# Patient Record
Sex: Male | Born: 1970 | Race: White | Hispanic: No | Marital: Single | Smoking: Former smoker
Health system: Southern US, Community
[De-identification: ages and names within clinical notes are randomized; demographics above are authoritative.]

## PROBLEM LIST (undated history)

## (undated) DIAGNOSIS — B019 Varicella without complication: Secondary | ICD-10-CM

## (undated) HISTORY — DX: Varicella without complication: B01.9

---

## 2006-11-29 ENCOUNTER — Emergency Department: Payer: Self-pay | Admitting: Emergency Medicine

## 2007-01-03 ENCOUNTER — Ambulatory Visit: Payer: Self-pay | Admitting: Urology

## 2008-03-17 IMAGING — US US PELVIS LIMITED
1 series · 17 of 25 positions shown · non-contrast
Comparison: none

REASON FOR EXAM: Previous US with Hypoechoic area
COMMENTS:

[Series 1: us pelvis limited · 17 of 55 slices shown]
[im 1/55]
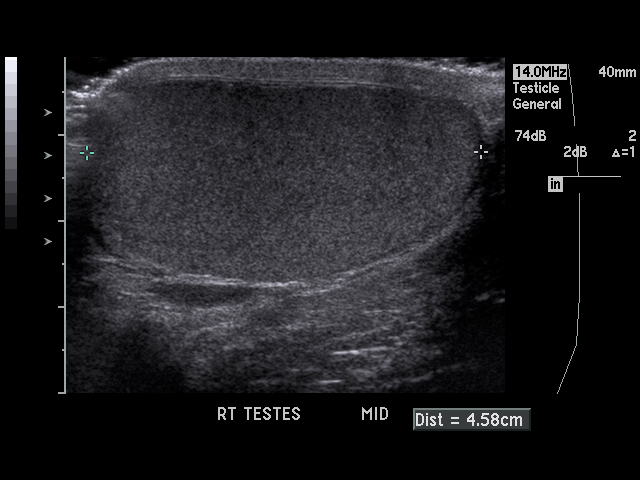
[im 5/55]
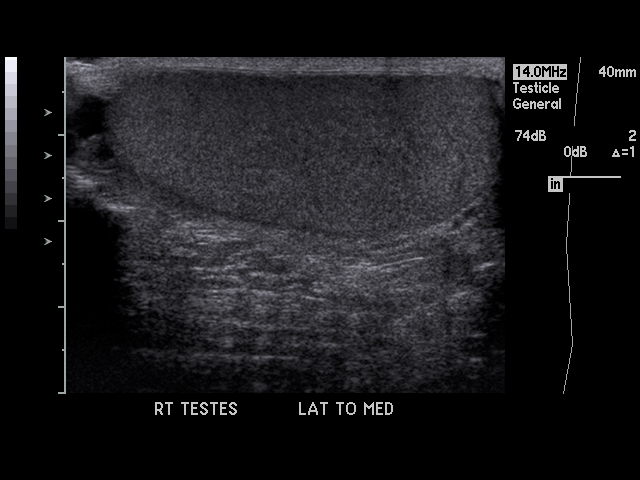
[im 7/55]
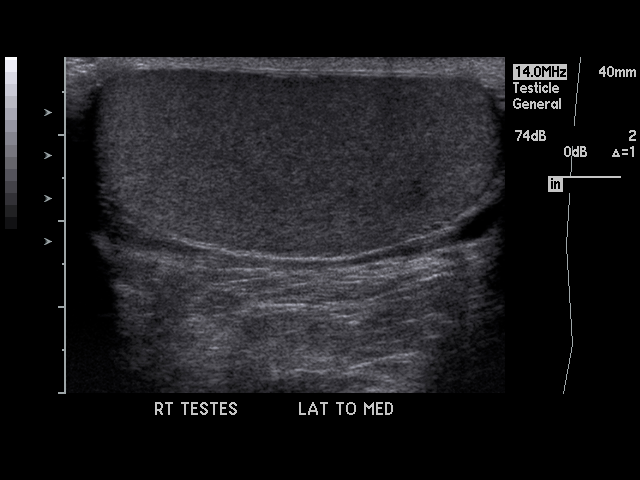
[im 12/55]
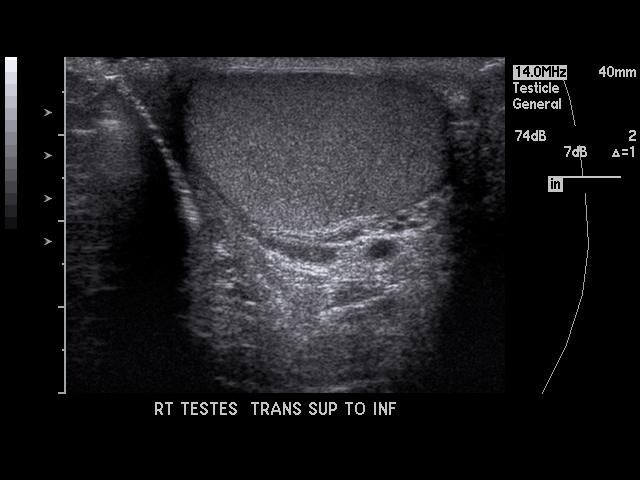
[im 14/55]
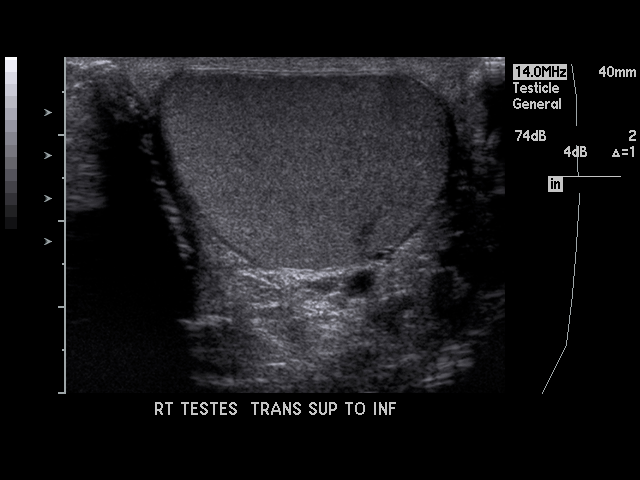
[im 19/55]
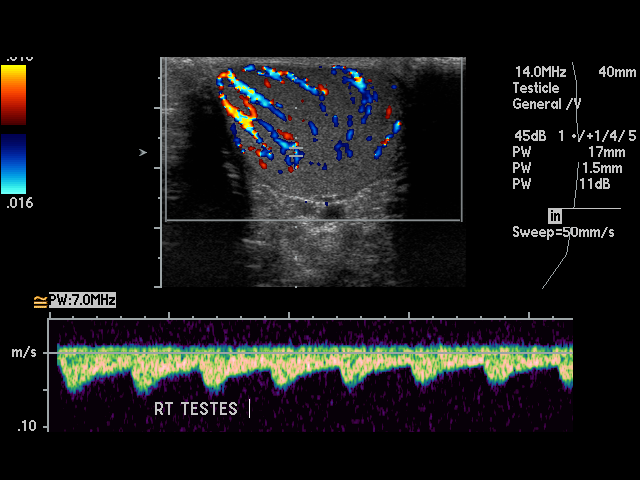
[im 21/55]
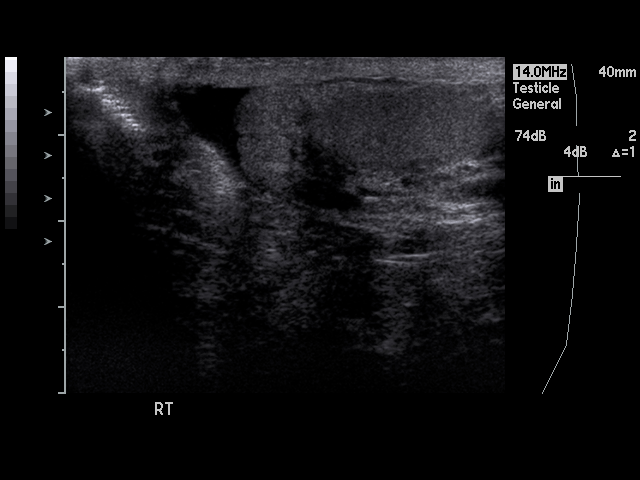
[im 25/55]
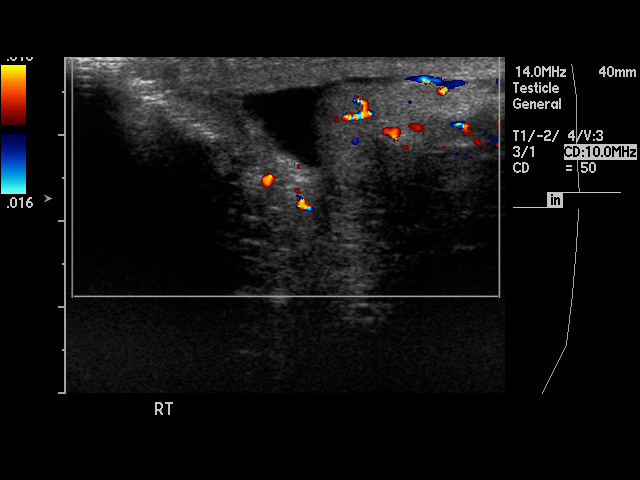
[im 28/55]
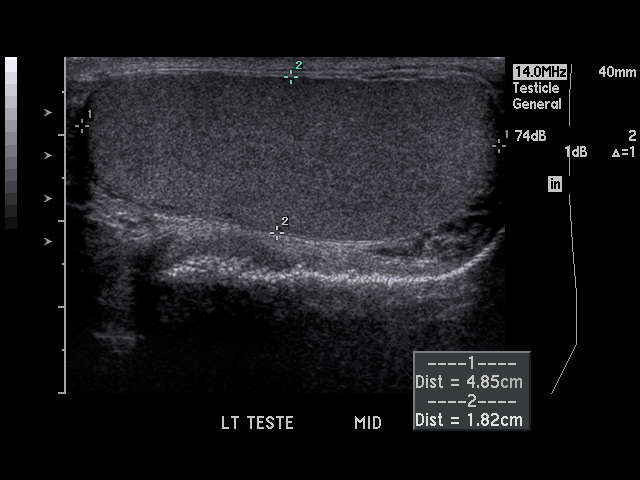
[im 30/55]
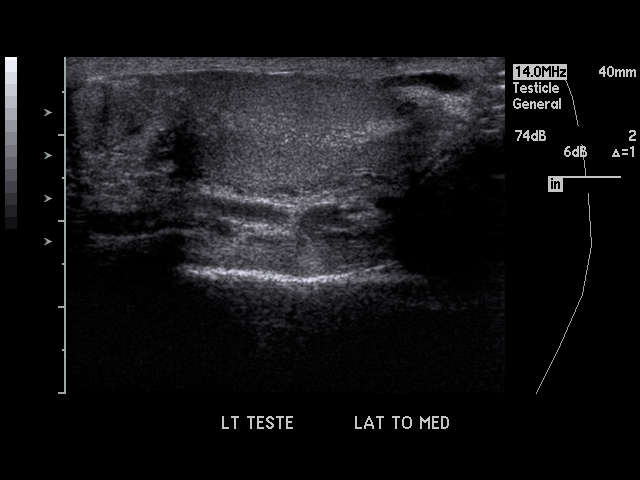
[im 34/55]
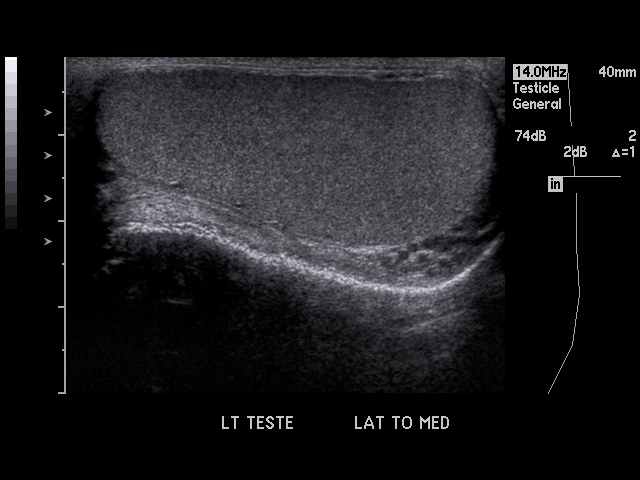
[im 37/55]
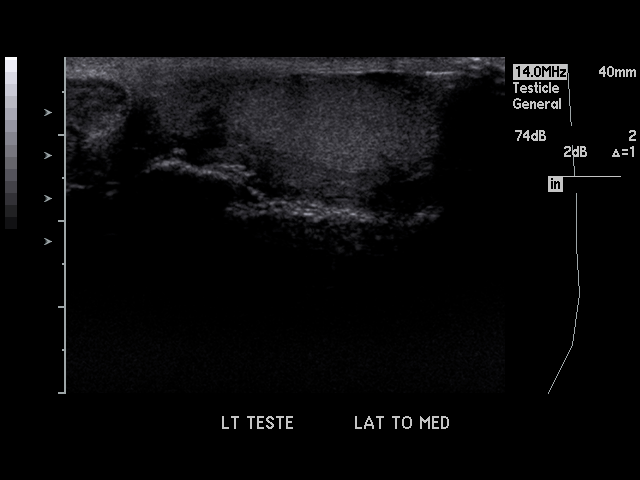
[im 41/55]
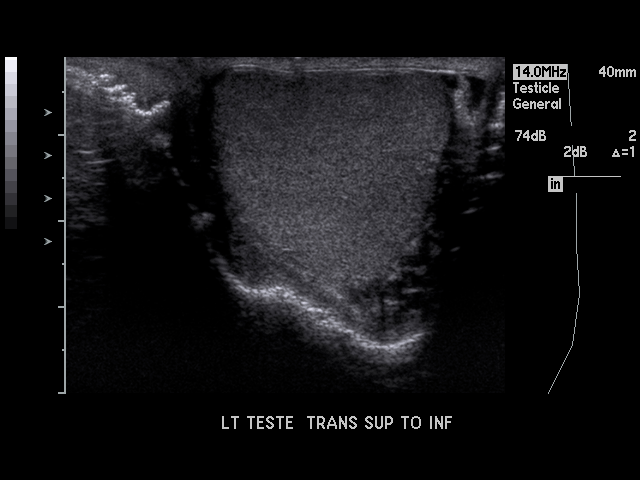
[im 43/55]
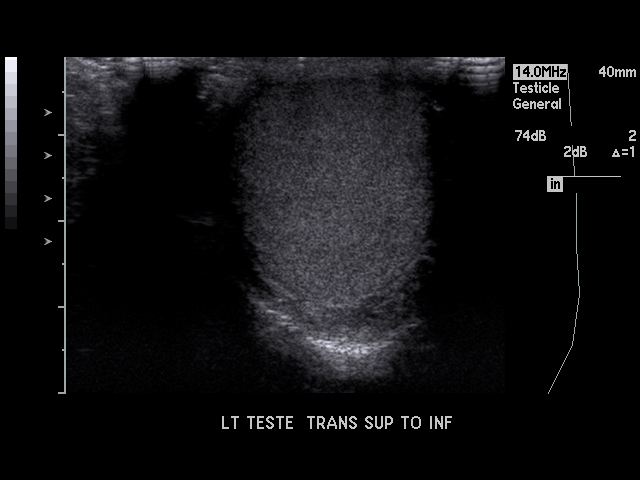
[im 48/55]
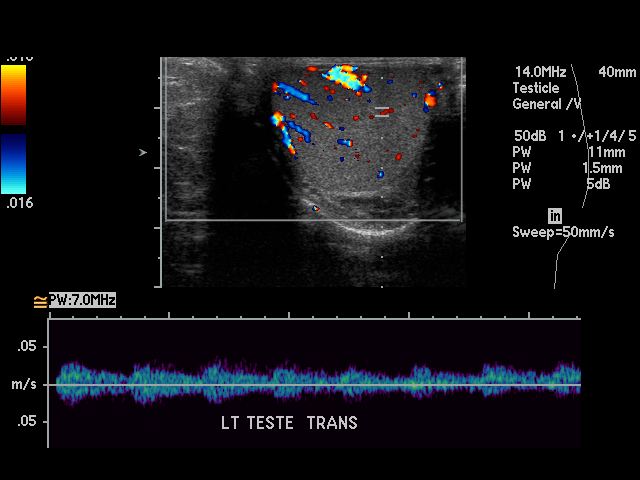
[im 50/55]
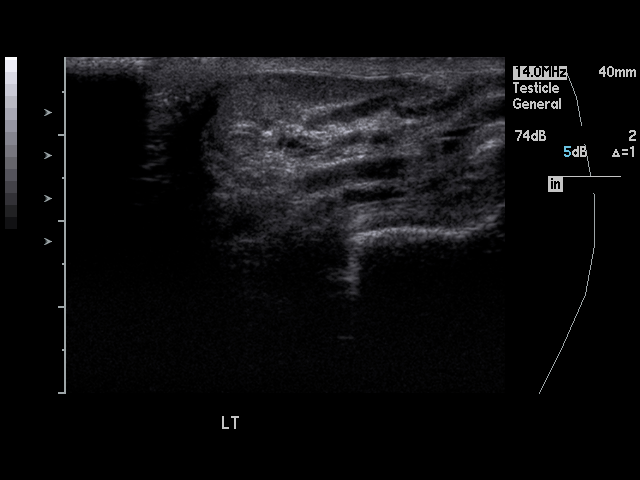
[im 55/55]
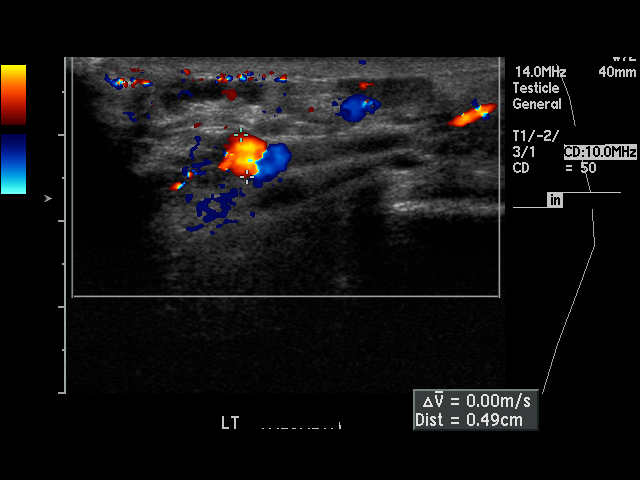

[17 of 25 positions shown; findings below may reference images not displayed]

PROCEDURE:     US  - US TESTICULAR  - January 03, 2007  [DATE]

RESULT:     The RIGHT testicle measures 4.71 cm x 3.18 cm x 3.06 cm and the
LEFT testicle measures 4.85 cm x 2.02 cm x 2.70 cm.  Doppler examination
shows normal-appearing vascular flow in each testicle.

Compared to the prior exam of 11/29/06, the focal hypoechoic area in the LEFT
testicle is no longer seen.
IMPRESSION: 1.     Normal study. The previously noted hypoechoic area in the LEFT
testicle is no longer seen.

## 2011-05-08 ENCOUNTER — Ambulatory Visit: Payer: Self-pay

## 2011-07-02 ENCOUNTER — Ambulatory Visit: Payer: Self-pay | Admitting: Internal Medicine

## 2013-07-08 ENCOUNTER — Ambulatory Visit: Payer: Self-pay | Admitting: Emergency Medicine

## 2013-08-18 ENCOUNTER — Ambulatory Visit: Payer: Self-pay | Admitting: Physician Assistant

## 2016-02-19 DIAGNOSIS — M9901 Segmental and somatic dysfunction of cervical region: Secondary | ICD-10-CM | POA: Diagnosis not present

## 2016-02-19 DIAGNOSIS — M5431 Sciatica, right side: Secondary | ICD-10-CM | POA: Diagnosis not present

## 2016-02-19 DIAGNOSIS — M5412 Radiculopathy, cervical region: Secondary | ICD-10-CM | POA: Diagnosis not present

## 2016-02-19 DIAGNOSIS — M9903 Segmental and somatic dysfunction of lumbar region: Secondary | ICD-10-CM | POA: Diagnosis not present

## 2016-03-14 DIAGNOSIS — M5431 Sciatica, right side: Secondary | ICD-10-CM | POA: Diagnosis not present

## 2016-03-14 DIAGNOSIS — M5412 Radiculopathy, cervical region: Secondary | ICD-10-CM | POA: Diagnosis not present

## 2016-03-14 DIAGNOSIS — M9901 Segmental and somatic dysfunction of cervical region: Secondary | ICD-10-CM | POA: Diagnosis not present

## 2016-03-14 DIAGNOSIS — M9903 Segmental and somatic dysfunction of lumbar region: Secondary | ICD-10-CM | POA: Diagnosis not present

## 2016-03-16 DIAGNOSIS — M9901 Segmental and somatic dysfunction of cervical region: Secondary | ICD-10-CM | POA: Diagnosis not present

## 2016-03-16 DIAGNOSIS — M9903 Segmental and somatic dysfunction of lumbar region: Secondary | ICD-10-CM | POA: Diagnosis not present

## 2016-03-16 DIAGNOSIS — M5412 Radiculopathy, cervical region: Secondary | ICD-10-CM | POA: Diagnosis not present

## 2016-03-16 DIAGNOSIS — M5431 Sciatica, right side: Secondary | ICD-10-CM | POA: Diagnosis not present

## 2016-03-23 DIAGNOSIS — M5431 Sciatica, right side: Secondary | ICD-10-CM | POA: Diagnosis not present

## 2016-03-23 DIAGNOSIS — M9901 Segmental and somatic dysfunction of cervical region: Secondary | ICD-10-CM | POA: Diagnosis not present

## 2016-03-23 DIAGNOSIS — M9903 Segmental and somatic dysfunction of lumbar region: Secondary | ICD-10-CM | POA: Diagnosis not present

## 2016-03-23 DIAGNOSIS — M5412 Radiculopathy, cervical region: Secondary | ICD-10-CM | POA: Diagnosis not present

## 2016-03-30 DIAGNOSIS — M9903 Segmental and somatic dysfunction of lumbar region: Secondary | ICD-10-CM | POA: Diagnosis not present

## 2016-03-30 DIAGNOSIS — M5431 Sciatica, right side: Secondary | ICD-10-CM | POA: Diagnosis not present

## 2016-03-30 DIAGNOSIS — M9901 Segmental and somatic dysfunction of cervical region: Secondary | ICD-10-CM | POA: Diagnosis not present

## 2016-03-30 DIAGNOSIS — M5412 Radiculopathy, cervical region: Secondary | ICD-10-CM | POA: Diagnosis not present

## 2016-04-06 DIAGNOSIS — M9903 Segmental and somatic dysfunction of lumbar region: Secondary | ICD-10-CM | POA: Diagnosis not present

## 2016-04-06 DIAGNOSIS — M9901 Segmental and somatic dysfunction of cervical region: Secondary | ICD-10-CM | POA: Diagnosis not present

## 2016-04-06 DIAGNOSIS — M5431 Sciatica, right side: Secondary | ICD-10-CM | POA: Diagnosis not present

## 2016-04-06 DIAGNOSIS — M5412 Radiculopathy, cervical region: Secondary | ICD-10-CM | POA: Diagnosis not present

## 2016-04-26 DIAGNOSIS — M9901 Segmental and somatic dysfunction of cervical region: Secondary | ICD-10-CM | POA: Diagnosis not present

## 2016-04-26 DIAGNOSIS — M9903 Segmental and somatic dysfunction of lumbar region: Secondary | ICD-10-CM | POA: Diagnosis not present

## 2016-04-26 DIAGNOSIS — M5431 Sciatica, right side: Secondary | ICD-10-CM | POA: Diagnosis not present

## 2016-04-26 DIAGNOSIS — M5412 Radiculopathy, cervical region: Secondary | ICD-10-CM | POA: Diagnosis not present

## 2016-05-17 DIAGNOSIS — M5431 Sciatica, right side: Secondary | ICD-10-CM | POA: Diagnosis not present

## 2016-05-17 DIAGNOSIS — M9901 Segmental and somatic dysfunction of cervical region: Secondary | ICD-10-CM | POA: Diagnosis not present

## 2016-05-17 DIAGNOSIS — M9903 Segmental and somatic dysfunction of lumbar region: Secondary | ICD-10-CM | POA: Diagnosis not present

## 2016-05-17 DIAGNOSIS — M5412 Radiculopathy, cervical region: Secondary | ICD-10-CM | POA: Diagnosis not present

## 2016-06-14 DIAGNOSIS — M9903 Segmental and somatic dysfunction of lumbar region: Secondary | ICD-10-CM | POA: Diagnosis not present

## 2016-06-14 DIAGNOSIS — M5412 Radiculopathy, cervical region: Secondary | ICD-10-CM | POA: Diagnosis not present

## 2016-06-14 DIAGNOSIS — M5431 Sciatica, right side: Secondary | ICD-10-CM | POA: Diagnosis not present

## 2016-06-14 DIAGNOSIS — M9901 Segmental and somatic dysfunction of cervical region: Secondary | ICD-10-CM | POA: Diagnosis not present

## 2016-07-12 DIAGNOSIS — M9903 Segmental and somatic dysfunction of lumbar region: Secondary | ICD-10-CM | POA: Diagnosis not present

## 2016-07-12 DIAGNOSIS — M9901 Segmental and somatic dysfunction of cervical region: Secondary | ICD-10-CM | POA: Diagnosis not present

## 2016-07-12 DIAGNOSIS — M5412 Radiculopathy, cervical region: Secondary | ICD-10-CM | POA: Diagnosis not present

## 2016-07-12 DIAGNOSIS — M5431 Sciatica, right side: Secondary | ICD-10-CM | POA: Diagnosis not present

## 2016-08-09 DIAGNOSIS — M5431 Sciatica, right side: Secondary | ICD-10-CM | POA: Diagnosis not present

## 2016-08-09 DIAGNOSIS — M9903 Segmental and somatic dysfunction of lumbar region: Secondary | ICD-10-CM | POA: Diagnosis not present

## 2016-08-09 DIAGNOSIS — M9901 Segmental and somatic dysfunction of cervical region: Secondary | ICD-10-CM | POA: Diagnosis not present

## 2016-08-09 DIAGNOSIS — M5412 Radiculopathy, cervical region: Secondary | ICD-10-CM | POA: Diagnosis not present

## 2016-09-14 DIAGNOSIS — M9901 Segmental and somatic dysfunction of cervical region: Secondary | ICD-10-CM | POA: Diagnosis not present

## 2016-09-14 DIAGNOSIS — M5431 Sciatica, right side: Secondary | ICD-10-CM | POA: Diagnosis not present

## 2016-09-14 DIAGNOSIS — M5412 Radiculopathy, cervical region: Secondary | ICD-10-CM | POA: Diagnosis not present

## 2016-09-14 DIAGNOSIS — M9903 Segmental and somatic dysfunction of lumbar region: Secondary | ICD-10-CM | POA: Diagnosis not present

## 2016-09-15 DIAGNOSIS — J069 Acute upper respiratory infection, unspecified: Secondary | ICD-10-CM | POA: Diagnosis not present

## 2016-10-18 DIAGNOSIS — M9903 Segmental and somatic dysfunction of lumbar region: Secondary | ICD-10-CM | POA: Diagnosis not present

## 2016-10-18 DIAGNOSIS — M5412 Radiculopathy, cervical region: Secondary | ICD-10-CM | POA: Diagnosis not present

## 2016-10-18 DIAGNOSIS — M5431 Sciatica, right side: Secondary | ICD-10-CM | POA: Diagnosis not present

## 2016-10-18 DIAGNOSIS — M9901 Segmental and somatic dysfunction of cervical region: Secondary | ICD-10-CM | POA: Diagnosis not present

## 2016-11-15 DIAGNOSIS — M9901 Segmental and somatic dysfunction of cervical region: Secondary | ICD-10-CM | POA: Diagnosis not present

## 2016-11-15 DIAGNOSIS — M5412 Radiculopathy, cervical region: Secondary | ICD-10-CM | POA: Diagnosis not present

## 2016-11-15 DIAGNOSIS — M5431 Sciatica, right side: Secondary | ICD-10-CM | POA: Diagnosis not present

## 2016-11-15 DIAGNOSIS — M9903 Segmental and somatic dysfunction of lumbar region: Secondary | ICD-10-CM | POA: Diagnosis not present

## 2016-12-13 DIAGNOSIS — M9903 Segmental and somatic dysfunction of lumbar region: Secondary | ICD-10-CM | POA: Diagnosis not present

## 2016-12-13 DIAGNOSIS — M9901 Segmental and somatic dysfunction of cervical region: Secondary | ICD-10-CM | POA: Diagnosis not present

## 2016-12-13 DIAGNOSIS — M5431 Sciatica, right side: Secondary | ICD-10-CM | POA: Diagnosis not present

## 2016-12-13 DIAGNOSIS — M5412 Radiculopathy, cervical region: Secondary | ICD-10-CM | POA: Diagnosis not present

## 2017-01-24 DIAGNOSIS — Z0000 Encounter for general adult medical examination without abnormal findings: Secondary | ICD-10-CM | POA: Diagnosis not present

## 2017-01-24 DIAGNOSIS — Z125 Encounter for screening for malignant neoplasm of prostate: Secondary | ICD-10-CM | POA: Diagnosis not present

## 2017-01-24 DIAGNOSIS — K219 Gastro-esophageal reflux disease without esophagitis: Secondary | ICD-10-CM | POA: Diagnosis not present

## 2017-02-07 DIAGNOSIS — M9901 Segmental and somatic dysfunction of cervical region: Secondary | ICD-10-CM | POA: Diagnosis not present

## 2017-02-07 DIAGNOSIS — M5431 Sciatica, right side: Secondary | ICD-10-CM | POA: Diagnosis not present

## 2017-02-07 DIAGNOSIS — M9903 Segmental and somatic dysfunction of lumbar region: Secondary | ICD-10-CM | POA: Diagnosis not present

## 2017-02-07 DIAGNOSIS — M5412 Radiculopathy, cervical region: Secondary | ICD-10-CM | POA: Diagnosis not present

## 2017-03-07 DIAGNOSIS — M5412 Radiculopathy, cervical region: Secondary | ICD-10-CM | POA: Diagnosis not present

## 2017-03-07 DIAGNOSIS — M9903 Segmental and somatic dysfunction of lumbar region: Secondary | ICD-10-CM | POA: Diagnosis not present

## 2017-03-07 DIAGNOSIS — M9901 Segmental and somatic dysfunction of cervical region: Secondary | ICD-10-CM | POA: Diagnosis not present

## 2017-03-07 DIAGNOSIS — M5431 Sciatica, right side: Secondary | ICD-10-CM | POA: Diagnosis not present

## 2017-04-11 DIAGNOSIS — M5412 Radiculopathy, cervical region: Secondary | ICD-10-CM | POA: Diagnosis not present

## 2017-04-11 DIAGNOSIS — M9903 Segmental and somatic dysfunction of lumbar region: Secondary | ICD-10-CM | POA: Diagnosis not present

## 2017-04-11 DIAGNOSIS — M5431 Sciatica, right side: Secondary | ICD-10-CM | POA: Diagnosis not present

## 2017-04-11 DIAGNOSIS — M9901 Segmental and somatic dysfunction of cervical region: Secondary | ICD-10-CM | POA: Diagnosis not present

## 2017-04-18 ENCOUNTER — Encounter: Payer: Self-pay | Admitting: Primary Care

## 2017-04-18 ENCOUNTER — Encounter (INDEPENDENT_AMBULATORY_CARE_PROVIDER_SITE_OTHER): Payer: Self-pay

## 2017-04-18 ENCOUNTER — Ambulatory Visit (INDEPENDENT_AMBULATORY_CARE_PROVIDER_SITE_OTHER): Payer: BLUE CROSS/BLUE SHIELD | Admitting: Primary Care

## 2017-04-18 VITALS — BP 126/74 | HR 84 | Temp 98.6°F | Ht 66.0 in | Wt 182.0 lb

## 2017-04-18 DIAGNOSIS — F329 Major depressive disorder, single episode, unspecified: Secondary | ICD-10-CM

## 2017-04-18 DIAGNOSIS — K219 Gastro-esophageal reflux disease without esophagitis: Secondary | ICD-10-CM | POA: Diagnosis not present

## 2017-04-18 DIAGNOSIS — F419 Anxiety disorder, unspecified: Secondary | ICD-10-CM

## 2017-04-18 DIAGNOSIS — F32A Depression, unspecified: Secondary | ICD-10-CM | POA: Insufficient documentation

## 2017-04-18 MED ORDER — SERTRALINE HCL 50 MG PO TABS: 50.0000 mg | ORAL_TABLET | Freq: Every day | ORAL | 1 refills | Status: DC

## 2017-04-18 NOTE — Patient Instructions (Addendum)
Start sertraline (Zoloft) 50 mg tablets for anxiety. Start by taking 1/2 tablet daily for the first 8 days, then increase to 1 full tablet thereafter.  Continue omeprazole 20 mg. Take this everyday for best symptom management.   Schedule a follow up visit with me in 6 weeks.  It was a pleasure to meet you today! Please don't hesitate to call me with any questions. Welcome to [facility]!    Food Choices for Gastroesophageal Reflux Disease, Adult When you have gastroesophageal reflux disease (GERD), the foods you eat and your eating habits are very important. Choosing the right foods can help ease your discomfort. What guidelines do I need to follow?  Choose fruits, vegetables, whole grains, and low-fat dairy products.  Choose low-fat meat, fish, and poultry.  Limit fats such as oils, salad dressings, butter, nuts, and avocado.  Keep a food diary. This helps you identify foods that cause symptoms.  Avoid foods that cause symptoms. These may be different for everyone.  Eat small meals often instead of 3 large meals a day.  Eat your meals slowly, in a place where you are relaxed.  Limit fried foods.  Cook foods using methods other than frying.  Avoid drinking alcohol.  Avoid drinking large amounts of liquids with your meals.  Avoid bending over or lying down until 2-3 hours after eating. What foods are not recommended? These are some foods and drinks that may make your symptoms worse: Vegetables Tomatoes. Tomato juice. Tomato and spaghetti sauce. Chili peppers. Onion and garlic. Horseradish. Fruits Oranges, grapefruit, and lemon (fruit and juice). Meats High-fat meats, fish, and poultry. This includes hot dogs, ribs, ham, sausage, salami, and bacon. Dairy Whole milk and chocolate milk. Sour cream. Cream. Butter. Ice cream. Cream cheese. Drinks Coffee and tea. Bubbly (carbonated) drinks or energy drinks. Condiments Hot sauce. Barbecue sauce. Sweets/Desserts Chocolate and  cocoa. Donuts. Peppermint and spearmint. Fats and Oils High-fat foods. This includes [place] fries and potato chips. Other Vinegar. Strong spices. This includes black pepper, white pepper, red pepper, cayenne, curry powder, cloves, ginger, and chili powder. The items listed above may not be a complete list of foods and drinks to avoid. Contact your dietitian for more information. This information is not intended to replace advice given to you by your health care provider. Make sure you discuss any questions you have with your health care provider. Document Released: 03/05/2012 Document Revised: 02/10/2016 Document Reviewed: 07/09/2013 Elsevier Interactive Patient Education  2017 [employer]

## 2017-04-18 NOTE — Assessment & Plan Note (Signed)
Symptoms well managed on omeprazole 20 mg, continue same.

## 2017-04-18 NOTE — Assessment & Plan Note (Signed)
Buspar causes dizziness, patient cannot tolerate. GAD 7 score of 12 today. Discussed that lorazepam is not best practice for treating frequent anxiety symptoms. Discussed different options, he opts for Zoloft.  Rx for Zoloft 50 mg sent to pharmacy. Patient is to take 1/2 tablet daily for 8 days, then advance to 1 full tablet thereafter. We discussed possible side effects of headache, GI upset, drowsiness, and SI/HI. If thoughts of SI/HI develop, we discussed to present to the emergency immediately. Patient verbalized understanding.   Follow up in 6 weeks for re-evaluation.

## 2017-04-18 NOTE — Progress Notes (Signed)
   Subjective:    Patient ID: Patrick Harrell, male    DOB: 11/15/1970, 46 y.o.   MRN: [id number]  HPI  Patrick Harrell is a 46 year old male who presents today to establish care and discuss the problems mentioned below. Will obtain old records. His last physical was in June/July 2018.  1) GERD: Currently managed on omeprazole 20 mg. He will experience symptoms of esophageal burning without his medication. Doesn't take this medication daily as he forgets and does experience acid reflux in between doses.   2) Generalized Anxiety Disorder: Currently prescribed buspirone 15 mg BID that was prescribed by his prior PCP. He's been taking 1/2 tablet twice daily as this medication causes dizziness, still causes dizziness with the reduced dose so he's not been taking this. He experiences palpitations, irritability, anxiety about 3-4 days weekly. GAD 7 score of 12 today. He denies SI/HI, depression. Once managed on lorazepam per original PCP years ago, did well on this medication.   Review of Systems  Constitutional: Negative for fatigue.  Respiratory: Negative for shortness of breath.   Cardiovascular: Negative for chest pain.  Gastrointestinal: Negative for abdominal pain.       GERD  Psychiatric/Behavioral:       See HPI       Past Medical History:  Diagnosis Date  . Chickenpox      Social History   Social History  . Marital status: Single    Spouse name: N/A  . Number of children: N/A  . Years of education: N/A   Occupational History  . Not on file.   Social History Main Topics  . Smoking status: Former Games developermoker  . Smokeless tobacco: Never Used  . Alcohol use No  . Drug use: Unknown  . Sexual activity: Not on file   Other Topics Concern  . Not on file   Social History Narrative   Divorced.   2 children.    Works for [employer].   Enjoys playing softball, watching TV.     No past surgical history on file.  Family History  Problem Relation Age of Onset  . Arthritis Father     . Hyperlipidemia Father   . Hypertension Father   . Arthritis Maternal Grandmother   . Kidney disease Maternal Grandfather   . Hyperlipidemia Paternal Grandmother     No Known Allergies  No current outpatient prescriptions on file prior to visit.   No current facility-administered medications on file prior to visit.     BP 126/74   Pulse 84   Temp 98.6 F (37 C) (Oral)   Ht 5\' 6"  (1.676 m)   Wt 182 lb (82.6 kg)   SpO2 97%   BMI 29.38 kg/m    Objective:   Physical Exam  Constitutional: He is oriented to person, place, and time. He appears well-nourished.  Neck: Neck supple.  Cardiovascular: Normal rate and regular rhythm.   Pulmonary/Chest: Effort normal and breath sounds normal. He has no wheezes. He has no rales.  Neurological: He is alert and oriented to person, place, and time.  Skin: Skin is warm and dry.  Psychiatric: He has a normal mood and affect.          Assessment & Plan:

## 2017-05-09 DIAGNOSIS — M9901 Segmental and somatic dysfunction of cervical region: Secondary | ICD-10-CM | POA: Diagnosis not present

## 2017-05-09 DIAGNOSIS — M9903 Segmental and somatic dysfunction of lumbar region: Secondary | ICD-10-CM | POA: Diagnosis not present

## 2017-05-09 DIAGNOSIS — M5431 Sciatica, right side: Secondary | ICD-10-CM | POA: Diagnosis not present

## 2017-05-09 DIAGNOSIS — M5412 Radiculopathy, cervical region: Secondary | ICD-10-CM | POA: Diagnosis not present

## 2017-05-18 ENCOUNTER — Other Ambulatory Visit: Payer: Self-pay | Admitting: Primary Care

## 2017-05-18 DIAGNOSIS — F32A Depression, unspecified: Secondary | ICD-10-CM

## 2017-05-18 DIAGNOSIS — F329 Major depressive disorder, single episode, unspecified: Secondary | ICD-10-CM

## 2017-05-18 DIAGNOSIS — F419 Anxiety disorder, unspecified: Principal | ICD-10-CM

## 2017-05-18 MED ORDER — SERTRALINE HCL 50 MG PO TABS: 50.0000 mg | ORAL_TABLET | Freq: Every day | ORAL | 0 refills | Status: DC

## 2017-05-18 NOTE — Telephone Encounter (Signed)
Received fax request for medication change to 90 days. Okay per Jae DireKate

## 2017-05-30 ENCOUNTER — Ambulatory Visit: Payer: BLUE CROSS/BLUE SHIELD | Admitting: Primary Care

## 2017-06-06 ENCOUNTER — Encounter: Payer: Self-pay | Admitting: Primary Care

## 2017-06-06 ENCOUNTER — Ambulatory Visit (INDEPENDENT_AMBULATORY_CARE_PROVIDER_SITE_OTHER): Payer: BLUE CROSS/BLUE SHIELD | Admitting: Primary Care

## 2017-06-06 VITALS — BP 120/76 | HR 70 | Temp 98.2°F | Ht 66.0 in | Wt 173.8 lb

## 2017-06-06 DIAGNOSIS — R5383 Other fatigue: Secondary | ICD-10-CM | POA: Diagnosis not present

## 2017-06-06 DIAGNOSIS — F32A Depression, unspecified: Secondary | ICD-10-CM

## 2017-06-06 DIAGNOSIS — F419 Anxiety disorder, unspecified: Secondary | ICD-10-CM | POA: Diagnosis not present

## 2017-06-06 DIAGNOSIS — M9901 Segmental and somatic dysfunction of cervical region: Secondary | ICD-10-CM | POA: Diagnosis not present

## 2017-06-06 DIAGNOSIS — F329 Major depressive disorder, single episode, unspecified: Secondary | ICD-10-CM

## 2017-06-06 DIAGNOSIS — M5431 Sciatica, right side: Secondary | ICD-10-CM | POA: Diagnosis not present

## 2017-06-06 DIAGNOSIS — M5412 Radiculopathy, cervical region: Secondary | ICD-10-CM | POA: Diagnosis not present

## 2017-06-06 DIAGNOSIS — M9903 Segmental and somatic dysfunction of lumbar region: Secondary | ICD-10-CM | POA: Diagnosis not present

## 2017-06-06 LAB — COMPREHENSIVE METABOLIC PANEL
ALBUMIN: 4.5 g/dL (ref 3.5–5.2)
ALK PHOS: 90 U/L (ref 39–117)
ALT: 42 U/L (ref 0–53)
AST: 30 U/L (ref 0–37)
BILIRUBIN TOTAL: 0.4 mg/dL (ref 0.2–1.2)
BUN: 17 mg/dL (ref 6–23)
CO2: 30 mEq/L (ref 19–32)
Calcium: 9.7 mg/dL (ref 8.4–10.5)
Chloride: 102 mEq/L (ref 96–112)
Creatinine, Ser: 1.02 mg/dL (ref 0.40–1.50)
GFR: 84.31 mL/min (ref >60.00)
GLUCOSE: 121 mg/dL — AB (ref 70–99)
Potassium: 3.7 mEq/L (ref 3.5–5.1)
Sodium: 140 mEq/L (ref 135–145)
TOTAL PROTEIN: 7.3 g/dL (ref 6.0–8.3)

## 2017-06-06 LAB — TSH: TSH: 0.53 u[IU]/mL (ref 0.35–4.50)

## 2017-06-06 LAB — CBC
HCT: 45.6 % (ref 39.0–52.0)
Hemoglobin: 15.5 g/dL (ref 13.0–17.0)
MCHC: 33.9 g/dL (ref 30.0–36.0)
MCV: 90.7 fl (ref 78.0–100.0)
Platelets: 296 10*3/uL (ref 150.0–400.0)
RBC: 5.03 Mil/uL (ref 4.22–5.81)
RDW: 13.4 % (ref 11.5–15.5)
WBC: 5.7 10*3/uL (ref 4.0–10.5)

## 2017-06-06 NOTE — Patient Instructions (Signed)
Complete lab work prior to leaving today. I will notify you of your results once received.   Continue Zoloft 50 mg tablets for anxiety.  It was a pleasure to see you today!

## 2017-06-06 NOTE — Progress Notes (Signed)
Subjective:    Patient ID: Patrick Harrell, male    DOB: 18-Apr-1971, 45 y.o.   MRN: [id number]  HPI  Patrick Harrell is a 46 year old male who presents today for follow up of anxiety disorder. He presented as a new patient in early August 2018 with complaints of palpitations, irritability, anxiety 3-4 days weekly. He was managed on Buspar but wasn't taking due to side effects of dizziness. His GAD 7 score was 12 so he was initiated on Zoloft.  Since his last visit he's doing better. He's not experiencing drowsiness, he can relax and is not feeling "on edge" or irritable. He did experience drowsiness for the first several days, but this passed. He denies SI/HI, GI upset. Overall he's feeling good.  2) Decreased Libido: Present for the past six months. Also with fatigue. He goes to bed around 9-9:30 pm and will wake at 5 am. He denies daytime drowsiness and doesn't feel the urge to nap during the day. He does endorse snoring, denies waking up gasping for air. He would like his testosterone checked as he thinks this may be the cause of his decreased libido. He denies family history of thyroid disorders.  Review of Systems  Constitutional: Positive for fatigue.  Eyes: Negative for visual disturbance.  Respiratory: Negative for shortness of breath.   Cardiovascular: Negative for chest pain.  Gastrointestinal: Negative for abdominal pain and nausea.  Genitourinary:       Decreased libido  Neurological: Negative for headaches.  Psychiatric/Behavioral:       See HPI, feeling improved on Zoloft       Past Medical History:  Diagnosis Date  . Chickenpox      Social History   Social History  . Marital status: Single    Spouse name: N/A  . Number of children: N/A  . Years of education: N/A   Occupational History  . Not on file.   Social History Main Topics  . Smoking status: Former Games developer  . Smokeless tobacco: Never Used  . Alcohol use No  . Drug use: Unknown  . Sexual activity: Not  on file   Other Topics Concern  . Not on file   Social History Narrative   Divorced.   2 children.    Works for [employer].   Enjoys playing softball, watching TV.     No past surgical history on file.  Family History  Problem Relation Age of Onset  . Arthritis Father   . Hyperlipidemia Father   . Hypertension Father   . Arthritis Maternal Grandmother   . Kidney disease Maternal Grandfather   . Hyperlipidemia Paternal Grandmother     No Known Allergies  Current Outpatient Prescriptions on File Prior to Visit  Medication Sig Dispense Refill  . omeprazole (PRILOSEC) 20 MG capsule Take 20 mg by mouth daily.    . sertraline (ZOLOFT) 50 MG tablet Take 1 tablet (50 mg total) by mouth daily. 90 tablet 0   No current facility-administered medications on file prior to visit.     BP 120/76   Pulse 70   Temp 98.2 F (36.8 C) (Oral)   Ht  (1.676 m)   Wt 173 lb 12.8 oz (78.8 kg)   SpO2 99%   BMI 28.05 kg/m    Objective:   Physical Exam  Constitutional: He appears well-nourished.  Neck: Neck supple. No thyromegaly present.  Cardiovascular: Normal rate and regular rhythm.   Pulmonary/Chest: Effort normal and breath sounds normal.  Skin:  Skin is warm and dry.  Psychiatric: He has a normal mood and affect.          Assessment & Plan:  Decreased Libido:  Present for the past 6 months, also with fatigue. Would like testosterone tested. Could be secondary to anxiety/depression, recently started Zoloft. Consider OSA on differential, but doesn't experience daytime drowsiness. Will check TSH, CBC, CMP, testosterone. Discussed to start exercising and improving diet.  Morrie Sheldon, NP

## 2017-06-06 NOTE — Assessment & Plan Note (Signed)
Improved on Zoloft. Denies SI/HI. Refills sent to pharmacy. Continue same.

## 2017-06-07 ENCOUNTER — Other Ambulatory Visit (INDEPENDENT_AMBULATORY_CARE_PROVIDER_SITE_OTHER): Payer: BLUE CROSS/BLUE SHIELD

## 2017-06-07 DIAGNOSIS — E739 Lactose intolerance, unspecified: Secondary | ICD-10-CM

## 2017-06-07 LAB — HEMOGLOBIN A1C: HEMOGLOBIN A1C: 5.5 % (ref 4.6–6.5)

## 2017-06-09 LAB — TESTOS,TOTAL,FREE AND SHBG (FEMALE)
FREE TESTOSTERONE: 52 pg/mL (ref 35.0–155.0)
SEX HORMONE BINDING: 24 nmol/L (ref 10–50)
Testosterone, Total, LC-MS-MS: 329 ng/dL (ref 250–1100)

## 2017-06-11 ENCOUNTER — Telehealth: Payer: Self-pay | Admitting: Primary Care

## 2017-06-11 NOTE — Telephone Encounter (Signed)
Patient returned Chan's call. °

## 2017-06-11 NOTE — Telephone Encounter (Signed)
Spoken and notified patient of Kate's comments. Patient verbalized understanding. 

## 2017-07-04 DIAGNOSIS — M5431 Sciatica, right side: Secondary | ICD-10-CM | POA: Diagnosis not present

## 2017-07-04 DIAGNOSIS — M5412 Radiculopathy, cervical region: Secondary | ICD-10-CM | POA: Diagnosis not present

## 2017-07-04 DIAGNOSIS — M9901 Segmental and somatic dysfunction of cervical region: Secondary | ICD-10-CM | POA: Diagnosis not present

## 2017-07-04 DIAGNOSIS — M9903 Segmental and somatic dysfunction of lumbar region: Secondary | ICD-10-CM | POA: Diagnosis not present

## 2017-08-01 DIAGNOSIS — M5431 Sciatica, right side: Secondary | ICD-10-CM | POA: Diagnosis not present

## 2017-08-01 DIAGNOSIS — M9901 Segmental and somatic dysfunction of cervical region: Secondary | ICD-10-CM | POA: Diagnosis not present

## 2017-08-01 DIAGNOSIS — M9903 Segmental and somatic dysfunction of lumbar region: Secondary | ICD-10-CM | POA: Diagnosis not present

## 2017-08-01 DIAGNOSIS — M5412 Radiculopathy, cervical region: Secondary | ICD-10-CM | POA: Diagnosis not present

## 2017-09-05 DIAGNOSIS — M9903 Segmental and somatic dysfunction of lumbar region: Secondary | ICD-10-CM | POA: Diagnosis not present

## 2017-09-05 DIAGNOSIS — M5412 Radiculopathy, cervical region: Secondary | ICD-10-CM | POA: Diagnosis not present

## 2017-09-05 DIAGNOSIS — M9901 Segmental and somatic dysfunction of cervical region: Secondary | ICD-10-CM | POA: Diagnosis not present

## 2017-09-05 DIAGNOSIS — M5431 Sciatica, right side: Secondary | ICD-10-CM | POA: Diagnosis not present

## 2017-09-12 DIAGNOSIS — J069 Acute upper respiratory infection, unspecified: Secondary | ICD-10-CM | POA: Diagnosis not present

## 2017-09-12 DIAGNOSIS — J029 Acute pharyngitis, unspecified: Secondary | ICD-10-CM | POA: Diagnosis not present

## 2017-10-10 DIAGNOSIS — M9901 Segmental and somatic dysfunction of cervical region: Secondary | ICD-10-CM | POA: Diagnosis not present

## 2017-10-10 DIAGNOSIS — M5431 Sciatica, right side: Secondary | ICD-10-CM | POA: Diagnosis not present

## 2017-10-10 DIAGNOSIS — M5412 Radiculopathy, cervical region: Secondary | ICD-10-CM | POA: Diagnosis not present

## 2017-10-10 DIAGNOSIS — M9903 Segmental and somatic dysfunction of lumbar region: Secondary | ICD-10-CM | POA: Diagnosis not present

## 2017-10-13 ENCOUNTER — Other Ambulatory Visit: Payer: Self-pay | Admitting: Primary Care

## 2017-10-13 DIAGNOSIS — F419 Anxiety disorder, unspecified: Principal | ICD-10-CM

## 2017-10-13 DIAGNOSIS — F329 Major depressive disorder, single episode, unspecified: Secondary | ICD-10-CM

## 2017-10-13 DIAGNOSIS — F32A Depression, unspecified: Secondary | ICD-10-CM

## 2017-11-07 DIAGNOSIS — M5431 Sciatica, right side: Secondary | ICD-10-CM | POA: Diagnosis not present

## 2017-11-07 DIAGNOSIS — M5412 Radiculopathy, cervical region: Secondary | ICD-10-CM | POA: Diagnosis not present

## 2017-11-07 DIAGNOSIS — M9903 Segmental and somatic dysfunction of lumbar region: Secondary | ICD-10-CM | POA: Diagnosis not present

## 2017-11-07 DIAGNOSIS — M9901 Segmental and somatic dysfunction of cervical region: Secondary | ICD-10-CM | POA: Diagnosis not present

## 2017-12-12 DIAGNOSIS — M5412 Radiculopathy, cervical region: Secondary | ICD-10-CM | POA: Diagnosis not present

## 2017-12-12 DIAGNOSIS — M5431 Sciatica, right side: Secondary | ICD-10-CM | POA: Diagnosis not present

## 2017-12-12 DIAGNOSIS — M9901 Segmental and somatic dysfunction of cervical region: Secondary | ICD-10-CM | POA: Diagnosis not present

## 2017-12-12 DIAGNOSIS — M9903 Segmental and somatic dysfunction of lumbar region: Secondary | ICD-10-CM | POA: Diagnosis not present

## 2018-01-16 DIAGNOSIS — J069 Acute upper respiratory infection, unspecified: Secondary | ICD-10-CM | POA: Diagnosis not present

## 2018-02-06 DIAGNOSIS — M9901 Segmental and somatic dysfunction of cervical region: Secondary | ICD-10-CM | POA: Diagnosis not present

## 2018-02-06 DIAGNOSIS — M5431 Sciatica, right side: Secondary | ICD-10-CM | POA: Diagnosis not present

## 2018-02-06 DIAGNOSIS — M5412 Radiculopathy, cervical region: Secondary | ICD-10-CM | POA: Diagnosis not present

## 2018-02-06 DIAGNOSIS — M9903 Segmental and somatic dysfunction of lumbar region: Secondary | ICD-10-CM | POA: Diagnosis not present

## 2018-02-16 ENCOUNTER — Other Ambulatory Visit: Payer: Self-pay | Admitting: Primary Care

## 2018-02-16 DIAGNOSIS — F329 Major depressive disorder, single episode, unspecified: Secondary | ICD-10-CM

## 2018-02-16 DIAGNOSIS — F32A Depression, unspecified: Secondary | ICD-10-CM

## 2018-02-16 DIAGNOSIS — F419 Anxiety disorder, unspecified: Principal | ICD-10-CM

## 2018-02-18 NOTE — Telephone Encounter (Deleted)
Ok to refill? Electronically refill request for  

## 2018-02-18 NOTE — Telephone Encounter (Signed)
Sending a letter for patient to schedule appointment.

## 2018-03-01 DIAGNOSIS — M5431 Sciatica, right side: Secondary | ICD-10-CM | POA: Diagnosis not present

## 2018-03-01 DIAGNOSIS — M9901 Segmental and somatic dysfunction of cervical region: Secondary | ICD-10-CM | POA: Diagnosis not present

## 2018-03-01 DIAGNOSIS — M9903 Segmental and somatic dysfunction of lumbar region: Secondary | ICD-10-CM | POA: Diagnosis not present

## 2018-03-01 DIAGNOSIS — M5412 Radiculopathy, cervical region: Secondary | ICD-10-CM | POA: Diagnosis not present

## 2018-03-27 DIAGNOSIS — M9901 Segmental and somatic dysfunction of cervical region: Secondary | ICD-10-CM | POA: Diagnosis not present

## 2018-03-27 DIAGNOSIS — M9903 Segmental and somatic dysfunction of lumbar region: Secondary | ICD-10-CM | POA: Diagnosis not present

## 2018-03-27 DIAGNOSIS — M5431 Sciatica, right side: Secondary | ICD-10-CM | POA: Diagnosis not present

## 2018-03-27 DIAGNOSIS — M5412 Radiculopathy, cervical region: Secondary | ICD-10-CM | POA: Diagnosis not present

## 2018-04-24 DIAGNOSIS — M5412 Radiculopathy, cervical region: Secondary | ICD-10-CM | POA: Diagnosis not present

## 2018-04-24 DIAGNOSIS — M9903 Segmental and somatic dysfunction of lumbar region: Secondary | ICD-10-CM | POA: Diagnosis not present

## 2018-04-24 DIAGNOSIS — M9901 Segmental and somatic dysfunction of cervical region: Secondary | ICD-10-CM | POA: Diagnosis not present

## 2018-04-24 DIAGNOSIS — M5431 Sciatica, right side: Secondary | ICD-10-CM | POA: Diagnosis not present

## 2018-05-22 DIAGNOSIS — M9903 Segmental and somatic dysfunction of lumbar region: Secondary | ICD-10-CM | POA: Diagnosis not present

## 2018-05-22 DIAGNOSIS — M9901 Segmental and somatic dysfunction of cervical region: Secondary | ICD-10-CM | POA: Diagnosis not present

## 2018-05-22 DIAGNOSIS — M5431 Sciatica, right side: Secondary | ICD-10-CM | POA: Diagnosis not present

## 2018-05-22 DIAGNOSIS — M5412 Radiculopathy, cervical region: Secondary | ICD-10-CM | POA: Diagnosis not present

## 2018-05-23 ENCOUNTER — Other Ambulatory Visit: Payer: Self-pay | Admitting: Primary Care

## 2018-05-23 DIAGNOSIS — F419 Anxiety disorder, unspecified: Principal | ICD-10-CM

## 2018-05-23 DIAGNOSIS — F329 Major depressive disorder, single episode, unspecified: Secondary | ICD-10-CM

## 2018-05-23 DIAGNOSIS — F32A Depression, unspecified: Secondary | ICD-10-CM

## 2018-06-12 DIAGNOSIS — M9903 Segmental and somatic dysfunction of lumbar region: Secondary | ICD-10-CM | POA: Diagnosis not present

## 2018-06-12 DIAGNOSIS — M5412 Radiculopathy, cervical region: Secondary | ICD-10-CM | POA: Diagnosis not present

## 2018-06-12 DIAGNOSIS — M5431 Sciatica, right side: Secondary | ICD-10-CM | POA: Diagnosis not present

## 2018-06-12 DIAGNOSIS — M9901 Segmental and somatic dysfunction of cervical region: Secondary | ICD-10-CM | POA: Diagnosis not present

## 2018-07-10 DIAGNOSIS — M5431 Sciatica, right side: Secondary | ICD-10-CM | POA: Diagnosis not present

## 2018-07-10 DIAGNOSIS — M5412 Radiculopathy, cervical region: Secondary | ICD-10-CM | POA: Diagnosis not present

## 2018-07-10 DIAGNOSIS — M9903 Segmental and somatic dysfunction of lumbar region: Secondary | ICD-10-CM | POA: Diagnosis not present

## 2018-07-10 DIAGNOSIS — M9901 Segmental and somatic dysfunction of cervical region: Secondary | ICD-10-CM | POA: Diagnosis not present

## 2018-07-14 DIAGNOSIS — J069 Acute upper respiratory infection, unspecified: Secondary | ICD-10-CM | POA: Diagnosis not present

## 2018-08-07 DIAGNOSIS — M9903 Segmental and somatic dysfunction of lumbar region: Secondary | ICD-10-CM | POA: Diagnosis not present

## 2018-08-07 DIAGNOSIS — M5431 Sciatica, right side: Secondary | ICD-10-CM | POA: Diagnosis not present

## 2018-08-07 DIAGNOSIS — M9901 Segmental and somatic dysfunction of cervical region: Secondary | ICD-10-CM | POA: Diagnosis not present

## 2018-08-07 DIAGNOSIS — M5412 Radiculopathy, cervical region: Secondary | ICD-10-CM | POA: Diagnosis not present

## 2018-08-28 DIAGNOSIS — L638 Other alopecia areata: Secondary | ICD-10-CM | POA: Diagnosis not present

## 2018-09-16 DIAGNOSIS — L649 Androgenic alopecia, unspecified: Secondary | ICD-10-CM | POA: Diagnosis not present

## 2018-09-16 DIAGNOSIS — L638 Other alopecia areata: Secondary | ICD-10-CM | POA: Diagnosis not present

## 2018-09-25 DIAGNOSIS — M9903 Segmental and somatic dysfunction of lumbar region: Secondary | ICD-10-CM | POA: Diagnosis not present

## 2018-09-25 DIAGNOSIS — M9901 Segmental and somatic dysfunction of cervical region: Secondary | ICD-10-CM | POA: Diagnosis not present

## 2018-09-25 DIAGNOSIS — M5412 Radiculopathy, cervical region: Secondary | ICD-10-CM | POA: Diagnosis not present

## 2018-09-25 DIAGNOSIS — M5431 Sciatica, right side: Secondary | ICD-10-CM | POA: Diagnosis not present

## 2018-10-07 DIAGNOSIS — Z2089 Contact with and (suspected) exposure to other communicable diseases: Secondary | ICD-10-CM | POA: Diagnosis not present

## 2018-10-07 DIAGNOSIS — J101 Influenza due to other identified influenza virus with other respiratory manifestations: Secondary | ICD-10-CM | POA: Diagnosis not present

## 2018-10-09 DIAGNOSIS — L659 Nonscarring hair loss, unspecified: Secondary | ICD-10-CM | POA: Diagnosis not present

## 2018-10-09 DIAGNOSIS — L638 Other alopecia areata: Secondary | ICD-10-CM | POA: Diagnosis not present

## 2018-10-23 DIAGNOSIS — M9903 Segmental and somatic dysfunction of lumbar region: Secondary | ICD-10-CM | POA: Diagnosis not present

## 2018-10-23 DIAGNOSIS — M9901 Segmental and somatic dysfunction of cervical region: Secondary | ICD-10-CM | POA: Diagnosis not present

## 2018-10-23 DIAGNOSIS — M5431 Sciatica, right side: Secondary | ICD-10-CM | POA: Diagnosis not present

## 2018-10-23 DIAGNOSIS — M5412 Radiculopathy, cervical region: Secondary | ICD-10-CM | POA: Diagnosis not present

## 2018-11-13 DIAGNOSIS — M9901 Segmental and somatic dysfunction of cervical region: Secondary | ICD-10-CM | POA: Diagnosis not present

## 2018-11-13 DIAGNOSIS — M5412 Radiculopathy, cervical region: Secondary | ICD-10-CM | POA: Diagnosis not present

## 2018-11-13 DIAGNOSIS — M9903 Segmental and somatic dysfunction of lumbar region: Secondary | ICD-10-CM | POA: Diagnosis not present

## 2018-11-13 DIAGNOSIS — M5431 Sciatica, right side: Secondary | ICD-10-CM | POA: Diagnosis not present

## 2018-12-04 DIAGNOSIS — L638 Other alopecia areata: Secondary | ICD-10-CM | POA: Diagnosis not present

## 2018-12-11 DIAGNOSIS — M5431 Sciatica, right side: Secondary | ICD-10-CM | POA: Diagnosis not present

## 2018-12-11 DIAGNOSIS — M5412 Radiculopathy, cervical region: Secondary | ICD-10-CM | POA: Diagnosis not present

## 2018-12-11 DIAGNOSIS — M9903 Segmental and somatic dysfunction of lumbar region: Secondary | ICD-10-CM | POA: Diagnosis not present

## 2018-12-11 DIAGNOSIS — M9901 Segmental and somatic dysfunction of cervical region: Secondary | ICD-10-CM | POA: Diagnosis not present

## 2019-01-08 DIAGNOSIS — M9901 Segmental and somatic dysfunction of cervical region: Secondary | ICD-10-CM | POA: Diagnosis not present

## 2019-01-08 DIAGNOSIS — M5431 Sciatica, right side: Secondary | ICD-10-CM | POA: Diagnosis not present

## 2019-01-08 DIAGNOSIS — M9903 Segmental and somatic dysfunction of lumbar region: Secondary | ICD-10-CM | POA: Diagnosis not present

## 2019-01-08 DIAGNOSIS — M5412 Radiculopathy, cervical region: Secondary | ICD-10-CM | POA: Diagnosis not present

## 2019-02-05 DIAGNOSIS — M9903 Segmental and somatic dysfunction of lumbar region: Secondary | ICD-10-CM | POA: Diagnosis not present

## 2019-02-05 DIAGNOSIS — M5431 Sciatica, right side: Secondary | ICD-10-CM | POA: Diagnosis not present

## 2019-02-05 DIAGNOSIS — M5412 Radiculopathy, cervical region: Secondary | ICD-10-CM | POA: Diagnosis not present

## 2019-02-05 DIAGNOSIS — M9901 Segmental and somatic dysfunction of cervical region: Secondary | ICD-10-CM | POA: Diagnosis not present

## 2019-03-05 DIAGNOSIS — M5412 Radiculopathy, cervical region: Secondary | ICD-10-CM | POA: Diagnosis not present

## 2019-03-05 DIAGNOSIS — M9903 Segmental and somatic dysfunction of lumbar region: Secondary | ICD-10-CM | POA: Diagnosis not present

## 2019-03-05 DIAGNOSIS — M9901 Segmental and somatic dysfunction of cervical region: Secondary | ICD-10-CM | POA: Diagnosis not present

## 2019-03-05 DIAGNOSIS — M5431 Sciatica, right side: Secondary | ICD-10-CM | POA: Diagnosis not present

## 2019-04-02 DIAGNOSIS — M9903 Segmental and somatic dysfunction of lumbar region: Secondary | ICD-10-CM | POA: Diagnosis not present

## 2019-04-02 DIAGNOSIS — M5412 Radiculopathy, cervical region: Secondary | ICD-10-CM | POA: Diagnosis not present

## 2019-04-02 DIAGNOSIS — M9901 Segmental and somatic dysfunction of cervical region: Secondary | ICD-10-CM | POA: Diagnosis not present

## 2019-04-02 DIAGNOSIS — M5431 Sciatica, right side: Secondary | ICD-10-CM | POA: Diagnosis not present

## 2019-05-07 DIAGNOSIS — M5431 Sciatica, right side: Secondary | ICD-10-CM | POA: Diagnosis not present

## 2019-05-07 DIAGNOSIS — M9901 Segmental and somatic dysfunction of cervical region: Secondary | ICD-10-CM | POA: Diagnosis not present

## 2019-05-07 DIAGNOSIS — M9903 Segmental and somatic dysfunction of lumbar region: Secondary | ICD-10-CM | POA: Diagnosis not present

## 2019-05-07 DIAGNOSIS — M5412 Radiculopathy, cervical region: Secondary | ICD-10-CM | POA: Diagnosis not present

## 2019-06-04 DIAGNOSIS — M5431 Sciatica, right side: Secondary | ICD-10-CM | POA: Diagnosis not present

## 2019-06-04 DIAGNOSIS — M9901 Segmental and somatic dysfunction of cervical region: Secondary | ICD-10-CM | POA: Diagnosis not present

## 2019-06-04 DIAGNOSIS — M9903 Segmental and somatic dysfunction of lumbar region: Secondary | ICD-10-CM | POA: Diagnosis not present

## 2019-06-04 DIAGNOSIS — M5412 Radiculopathy, cervical region: Secondary | ICD-10-CM | POA: Diagnosis not present

## 2019-07-02 DIAGNOSIS — M5431 Sciatica, right side: Secondary | ICD-10-CM | POA: Diagnosis not present

## 2019-07-02 DIAGNOSIS — M5412 Radiculopathy, cervical region: Secondary | ICD-10-CM | POA: Diagnosis not present

## 2019-07-02 DIAGNOSIS — M9901 Segmental and somatic dysfunction of cervical region: Secondary | ICD-10-CM | POA: Diagnosis not present

## 2019-07-02 DIAGNOSIS — M9903 Segmental and somatic dysfunction of lumbar region: Secondary | ICD-10-CM | POA: Diagnosis not present

## 2019-07-30 DIAGNOSIS — M5431 Sciatica, right side: Secondary | ICD-10-CM | POA: Diagnosis not present

## 2019-07-30 DIAGNOSIS — M9901 Segmental and somatic dysfunction of cervical region: Secondary | ICD-10-CM | POA: Diagnosis not present

## 2019-07-30 DIAGNOSIS — M5412 Radiculopathy, cervical region: Secondary | ICD-10-CM | POA: Diagnosis not present

## 2019-07-30 DIAGNOSIS — M9903 Segmental and somatic dysfunction of lumbar region: Secondary | ICD-10-CM | POA: Diagnosis not present

## 2019-08-20 DIAGNOSIS — M5412 Radiculopathy, cervical region: Secondary | ICD-10-CM | POA: Diagnosis not present

## 2019-08-20 DIAGNOSIS — M5431 Sciatica, right side: Secondary | ICD-10-CM | POA: Diagnosis not present

## 2019-08-20 DIAGNOSIS — M9903 Segmental and somatic dysfunction of lumbar region: Secondary | ICD-10-CM | POA: Diagnosis not present

## 2019-08-20 DIAGNOSIS — M9901 Segmental and somatic dysfunction of cervical region: Secondary | ICD-10-CM | POA: Diagnosis not present

## 2019-09-09 DIAGNOSIS — M5412 Radiculopathy, cervical region: Secondary | ICD-10-CM | POA: Diagnosis not present

## 2019-09-09 DIAGNOSIS — M9901 Segmental and somatic dysfunction of cervical region: Secondary | ICD-10-CM | POA: Diagnosis not present

## 2019-09-09 DIAGNOSIS — M5431 Sciatica, right side: Secondary | ICD-10-CM | POA: Diagnosis not present

## 2019-09-09 DIAGNOSIS — M9903 Segmental and somatic dysfunction of lumbar region: Secondary | ICD-10-CM | POA: Diagnosis not present
